# Patient Record
Sex: Female | Born: 2000 | Hispanic: Refuse to answer | Marital: Single | Smoking: Never smoker
Health system: Southern US, Community
[De-identification: ages and names within clinical notes are randomized; demographics above are authoritative.]

## PROBLEM LIST (undated history)

## (undated) DIAGNOSIS — D573 Sickle-cell trait: Secondary | ICD-10-CM

## (undated) HISTORY — DX: Sickle-cell trait: D57.3

---
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
## 2019-01-02 ENCOUNTER — Other Ambulatory Visit: Payer: Self-pay

## 2019-01-02 ENCOUNTER — Other Ambulatory Visit: Payer: PRIVATE HEALTH INSURANCE | Admitting: Family Medicine

## 2019-01-02 ENCOUNTER — Ambulatory Visit
Admission: RE | Admit: 2019-01-02 | Discharge: 2019-01-02 | Disposition: A | Discharge status: Home / Self Care | Payer: BC Managed Care – PPO | Attending: Family Medicine | Admitting: Family Medicine

## 2019-01-02 DIAGNOSIS — Z0000 Encounter for general adult medical examination without abnormal findings: Secondary | ICD-10-CM

## 2019-01-02 DIAGNOSIS — Z8616 Personal history of COVID-19: Secondary | ICD-10-CM

## 2019-01-02 DIAGNOSIS — Z8619 Personal history of other infectious and parasitic diseases: Secondary | ICD-10-CM

## 2019-01-03 ENCOUNTER — Ambulatory Visit
Admission: RE | Admit: 2019-01-03 | Discharge: 2019-01-03 | Disposition: A | Discharge status: Home / Self Care | Payer: BC Managed Care – PPO | Source: Ambulatory Visit | Attending: Family Medicine | Admitting: Family Medicine

## 2019-01-03 ENCOUNTER — Ambulatory Visit
Admission: RE | Admit: 2019-01-03 | Discharge: 2019-01-03 | Disposition: A | Discharge status: Home / Self Care | Payer: BC Managed Care – PPO | Attending: Family Medicine | Admitting: Family Medicine

## 2019-01-03 ENCOUNTER — Other Ambulatory Visit: Payer: Self-pay | Admitting: Family Medicine

## 2019-01-03 DIAGNOSIS — R0789 Other chest pain: Secondary | ICD-10-CM

## 2019-01-04 ENCOUNTER — Encounter: Payer: Self-pay | Admitting: Family Medicine

## 2019-01-05 LAB — EUROIMMUN SARS-COV-2 AB, IGG: Euroimmun SARS-CoV-2 Ab, IgG: NEGATIVE

## 2019-01-05 LAB — TROPONIN I: Troponin I: <0.01 ng/mL (ref 0.00–0.04)

## 2019-01-05 LAB — SAR COV2 SEROLOGY (COVID19)AB(IGG),IA

## 2019-01-06 ENCOUNTER — Other Ambulatory Visit: Payer: Self-pay | Admitting: Family Medicine

## 2019-01-06 DIAGNOSIS — R0789 Other chest pain: Secondary | ICD-10-CM

## 2020-03-07 IMAGING — CR DG CHEST 2V
1 series · 2 of 2 positions shown · non-contrast
Comparison: None.

CLINICAL DATA: Acute cough and RIGHT chest pain for 2 weeks.

EXAM:
CHEST - 2 VIEW

[Series 1: dg chest 2 view · 0.14mm/px · 2 of 2 slices shown]
[im 1/2]
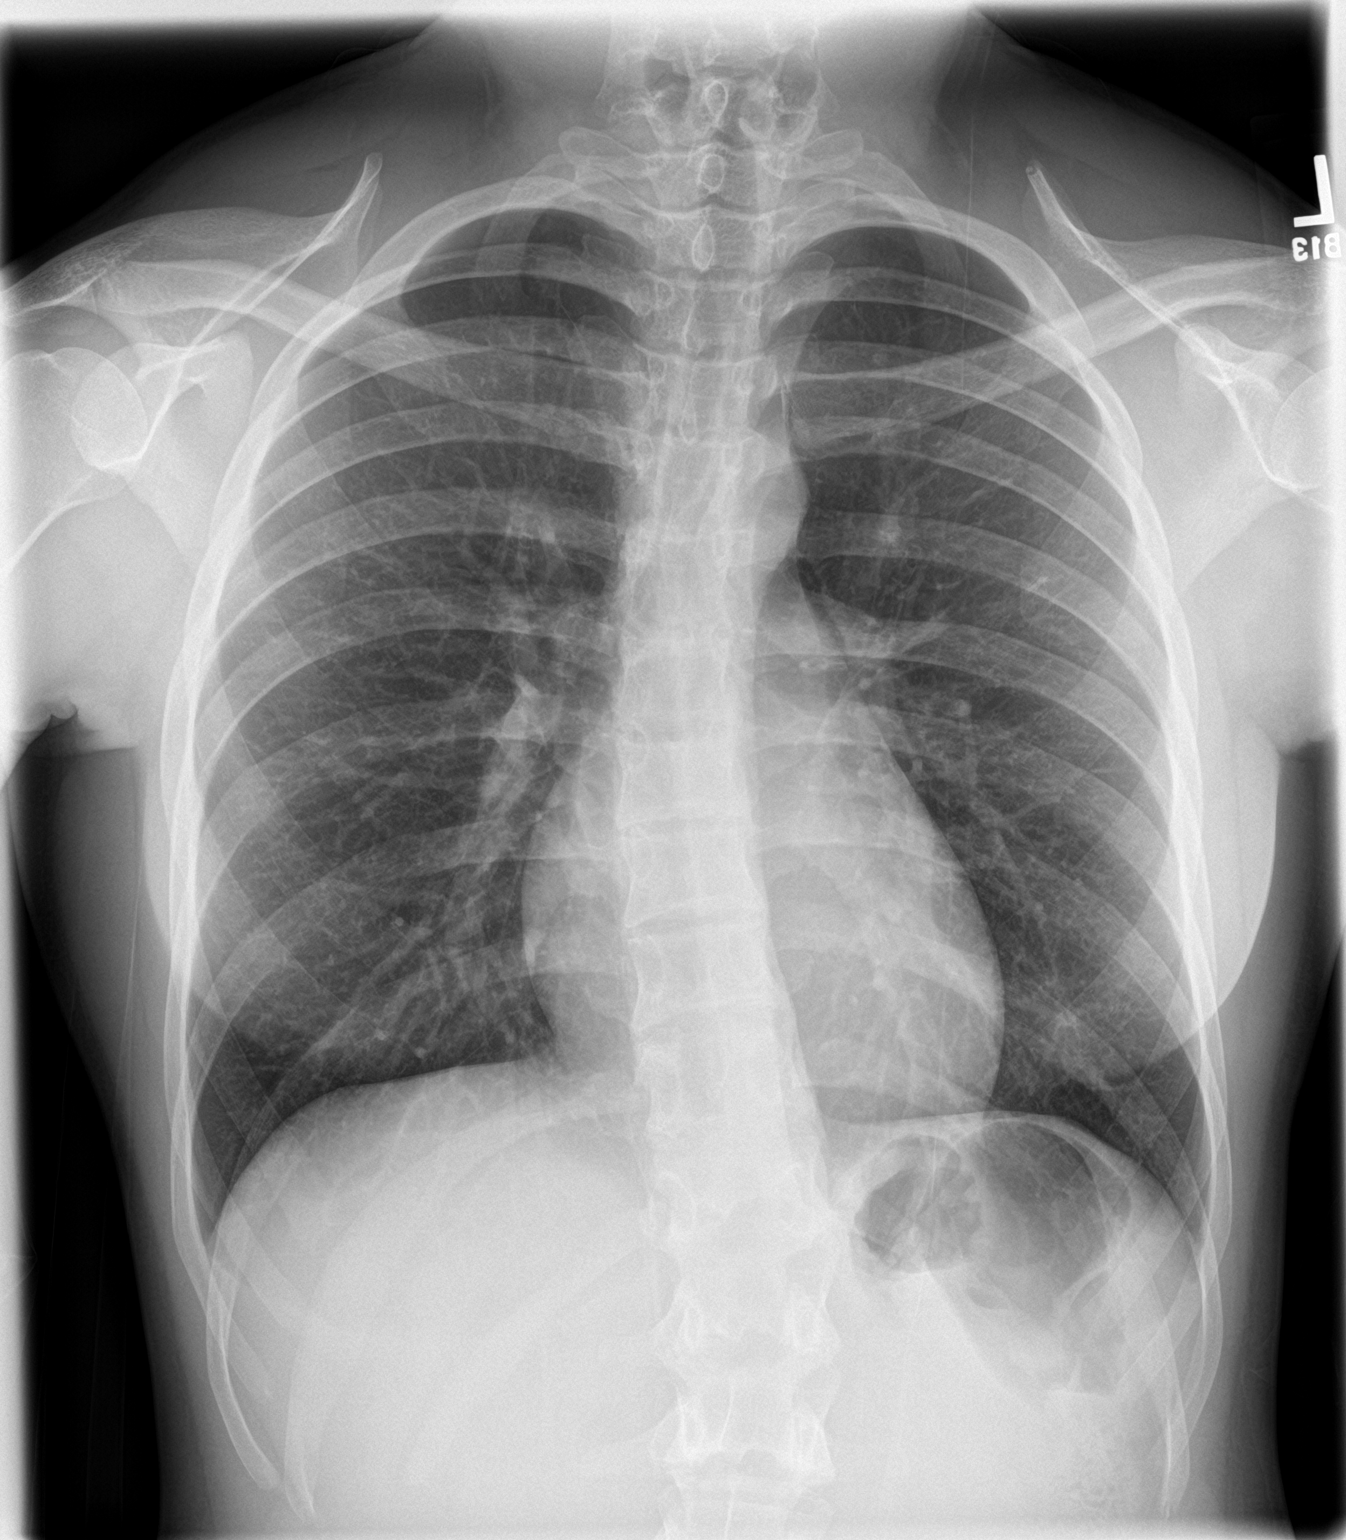
[im 2/2]
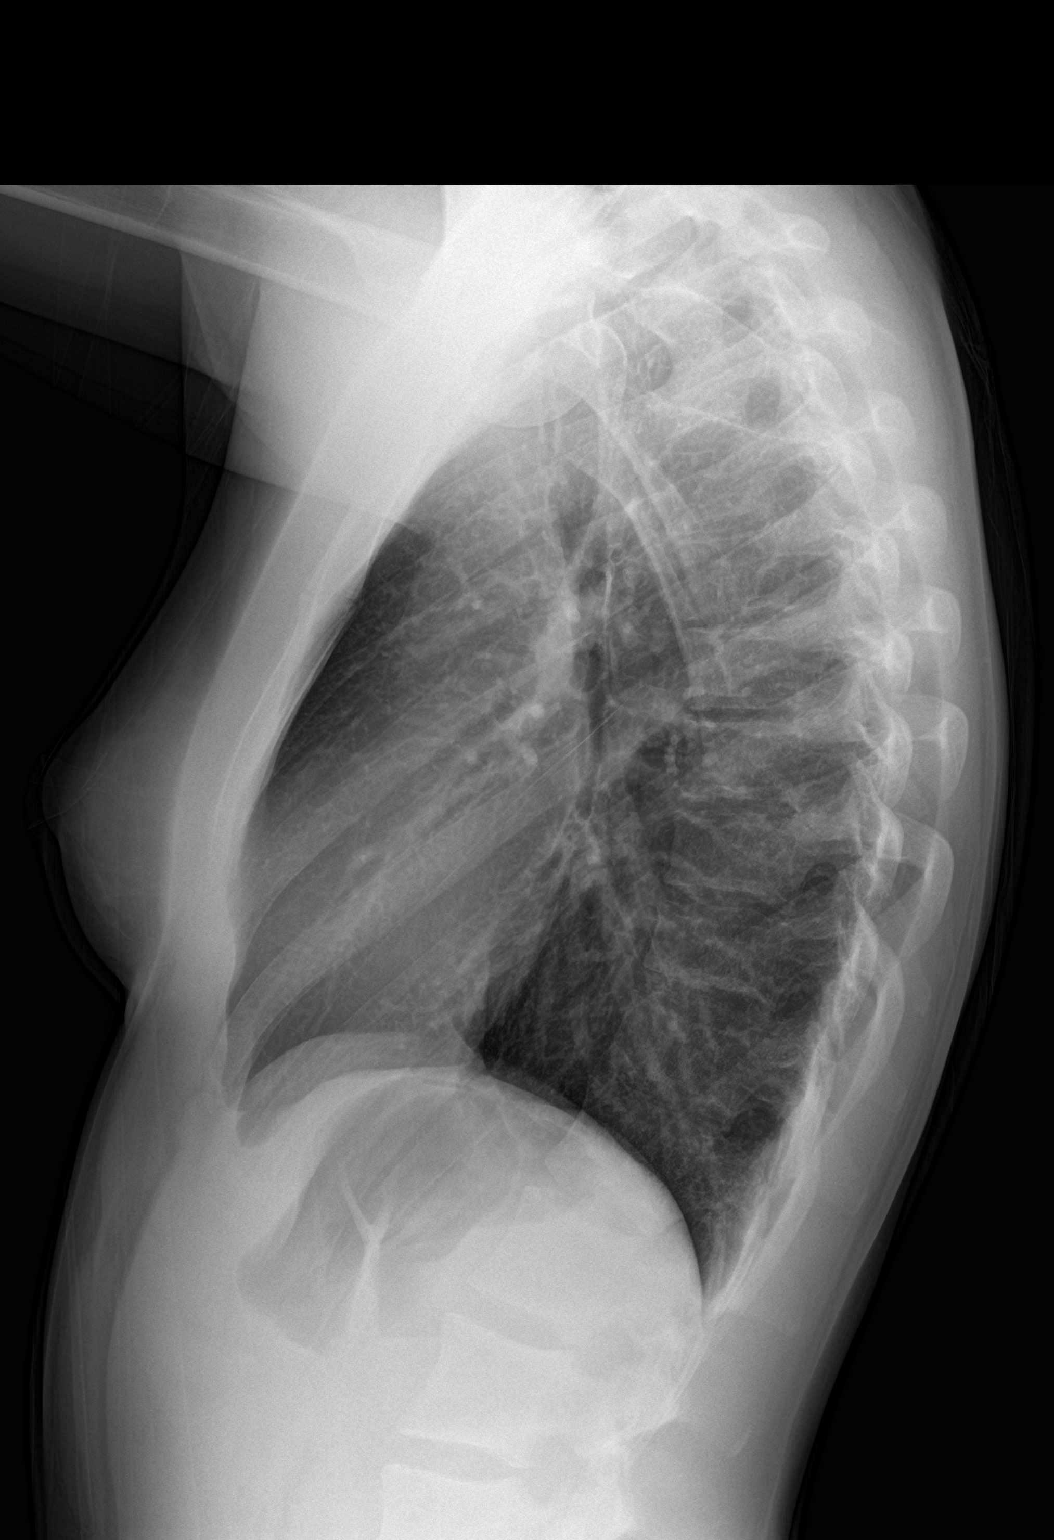

[2 of 2 positions shown; findings below may reference images not displayed]

FINDINGS: The cardiomediastinal silhouette is unremarkable.

There is no evidence of focal airspace disease, pulmonary edema,
suspicious pulmonary nodule/mass, pleural effusion, or pneumothorax.

No acute bony abnormalities are identified.
IMPRESSION: No active cardiopulmonary disease.
